# Patient Record
Sex: Female | Born: 1955 | Hispanic: Yes | Marital: Single
Health system: Southern US, Community
[De-identification: ages and names within clinical notes are randomized; demographics above are authoritative.]

---

## 2004-06-10 ENCOUNTER — Ambulatory Visit: Payer: Self-pay

## 2004-12-01 ENCOUNTER — Ambulatory Visit: Payer: Self-pay

## 2005-12-21 ENCOUNTER — Ambulatory Visit: Payer: Self-pay

## 2007-04-26 ENCOUNTER — Ambulatory Visit: Payer: Self-pay | Admitting: Family Medicine

## 2007-08-29 ENCOUNTER — Ambulatory Visit: Payer: Self-pay

## 2011-04-14 ENCOUNTER — Ambulatory Visit: Payer: Self-pay

## 2011-04-15 ENCOUNTER — Ambulatory Visit: Payer: Self-pay

## 2013-03-26 ENCOUNTER — Ambulatory Visit: Payer: Self-pay | Admitting: Family Medicine

## 2015-12-10 ENCOUNTER — Other Ambulatory Visit: Payer: Self-pay

## 2015-12-10 ENCOUNTER — Ambulatory Visit: Payer: Self-pay

## 2015-12-10 ENCOUNTER — Encounter: Payer: Self-pay | Admitting: *Deleted

## 2015-12-10 ENCOUNTER — Ambulatory Visit: Payer: Self-pay | Attending: Oncology | Admitting: *Deleted

## 2015-12-10 VITALS — BP 128/77 | HR 74 | Temp 98.0°F | Ht 60.63 in | Wt 179.6 lb

## 2015-12-10 DIAGNOSIS — Z0000 Encounter for general adult medical examination without abnormal findings: Secondary | ICD-10-CM

## 2015-12-10 DIAGNOSIS — N63 Unspecified lump in unspecified breast: Secondary | ICD-10-CM

## 2015-12-10 NOTE — Patient Instructions (Signed)
Prueba del VPH (HPV Test) La prueba del virus del papiloma humano (VPH) se usa para detectar los tipos de infeccin por el VPH de alto riesgo. El VPH es un grupo de alrededor de 100 virus. Muchos de estos virus causan tumores dentro de los genitales, sobre ellos o a su alrededor. La mayora de los VPH provocan infecciones que suelen desaparecen sin tratamiento. Sin embargo, los tipos 6, 11, 16 y 18 del VPH se consideran de alto riesgo y pueden aumentar el riesgo de padecer cncer de cuello del tero o de ano si la infeccin no se trata. La prueba del VPH identifica las cadenas de ADN (genticas) de la infeccin por el VPH, por lo que tambin se denomina prueba de ADN para el VPH. Aunque el VPH se encuentra tanto en los hombres como en las mujeres, la prueba del VPH se usa solo para detectar un mayor riesgo de cncer en las mujeres:  Con una prueba de Papanicolaou anormal.  Despus del tratamiento de una prueba de Papanicolaou anormal.  Entre 30y 65aos.  Despus del tratamiento de una infeccin por el VPH de alto riesgo. La prueba del VPH se puede hacer al mismo tiempo que un examen plvico y una prueba de Papanicolaou en mujeres de ms de 30 aos. Tanto la prueba del VPH como la prueba de Papanicolaou requieren una muestra de clulas del cuello del tero. PREPARACIN PARA EL ESTUDIO  No se haga lavados vaginales ni se d un bao durante 24 a 48 horas antes de la prueba o como se lo haya indicado el mdico.  No tenga sexo durante 24 a 48 horas antes de la prueba o como se lo haya indicado el mdico.  Es posible que se le pida que reprograme la prueba si est menstruando.  Se le pedir que orine antes de la prueba. RESULTADOS Es su responsabilidad retirar el resultado del estudio. Consulte en el laboratorio o en el departamento en el que fue realizado el estudio cundo y cmo podr obtener los resultados. Hable con el mdico si tiene alguna pregunta sobre los resultados. El resultado ser  negativo o positivo. Significado de los resultados negativos de la prueba Un resultado negativo de la prueba del VPH significa que no se detect el VPH, y es muy probable que no tenga el virus. Significado de los resultados positivos de la prueba Un resultado positivo de la prueba del VPH indica que tiene el virus.  Si el resultado de la prueba muestra la presencia de alguna cadena del VPH de alto riesgo, puede tener mayor riesgo de padecer cncer de cuello del tero o de ano si la infeccin no se trata.  Si se encuentran cadenas del VPH de bajo riesgo, es poco probable que tenga un alto riesgo de padecer cncer. Hable con el mdico sobre los resultados. El mdico utilizar los resultados para realizar un diagnstico y determinar un plan de tratamiento adecuado para usted. Esta informacin no tiene como fin reemplazar el consejo del mdico. Asegrese de hacerle al mdico cualquier pregunta que tenga. Document Released: 04/29/2008 Document Revised: 02/01/2014 Document Reviewed: 05/29/2013 Elsevier Interactive Patient Education  2017 [employer]  Gave patient hand-out, Women Staying Healthy, Active and Well from [facility], with education on breast health, pap smears, heart and colon health.  

## 2015-12-10 NOTE — Progress Notes (Signed)
Subjective:     Patient ID: Gloria Dunkerosalva Vallejano  Alvarez, female   DOB: 04/03/1955, 60 y.o.   MRN: [id number]  HPI   Review of Systems     Objective:   Physical Exam  Pulmonary/Chest:    Abdominal:    Genitourinary: No labial fusion. There is no rash, tenderness, lesion or injury on the right labia. There is no rash, tenderness, lesion or injury on the left labia. Cervix exhibits no motion tenderness, no discharge and no friability. Right adnexum displays no mass, no tenderness and no fullness. Left adnexum displays no mass, no tenderness and no fullness. No erythema or bleeding in the vagina. No foreign body in the vagina. No signs of injury around the vagina. No vaginal discharge found.       Assessment:     60 year [facility] Hispanic [facility]  Lloyda the interpreter present during the interview and exam.  On clinical breast breast exam bilateral breast have fibroglandular like tissue.  There is an asymmetrical 2 cm glandular like nodule at 8:00 right breast.  While the patient flexes her arms, there is notable induration at 8:00 right breast.  Taught self breast awareness.  Specimen collected for pap smear.  Patient has been screened for eligibility.  She does not have any insurance, Medicare or Medicaid.  She also meets financial eligibility.  Hand-out given on the Affordable Care Act.    Plan:     Bilateral diagnostic mammogram and ultrasound ordered.  Specimen for pap smear sent to the lab.  Will follow-up per protocol.

## 2015-12-14 LAB — PAP LB AND HPV HIGH-RISK
HPV, high-risk: NEGATIVE
PAP SMEAR COMMENT: 0

## 2015-12-17 ENCOUNTER — Ambulatory Visit
Admission: RE | Admit: 2015-12-17 | Discharge: 2015-12-17 | Disposition: A | Discharge status: Home / Self Care | Payer: Self-pay | Source: Ambulatory Visit | Attending: Oncology | Admitting: Oncology

## 2015-12-17 ENCOUNTER — Other Ambulatory Visit: Payer: Self-pay | Admitting: *Deleted

## 2015-12-17 ENCOUNTER — Ambulatory Visit: Payer: Self-pay | Attending: Oncology

## 2015-12-17 ENCOUNTER — Inpatient Hospital Stay
Admission: RE | Admit: 2015-12-17 | Discharge: 2015-12-17 | Disposition: A | Discharge status: Home / Self Care | Payer: Self-pay | Source: Ambulatory Visit | Attending: *Deleted | Admitting: *Deleted

## 2015-12-17 DIAGNOSIS — N63 Unspecified lump in unspecified breast: Secondary | ICD-10-CM

## 2015-12-17 DIAGNOSIS — Z9289 Personal history of other medical treatment: Secondary | ICD-10-CM

## 2015-12-22 ENCOUNTER — Other Ambulatory Visit: Payer: Self-pay | Admitting: *Deleted

## 2015-12-22 DIAGNOSIS — N63 Unspecified lump in unspecified breast: Secondary | ICD-10-CM

## 2015-12-24 ENCOUNTER — Other Ambulatory Visit: Payer: Self-pay | Admitting: *Deleted

## 2015-12-24 DIAGNOSIS — N63 Unspecified lump in unspecified breast: Secondary | ICD-10-CM

## 2016-01-05 ENCOUNTER — Ambulatory Visit
Admission: RE | Admit: 2016-01-05 | Discharge: 2016-01-05 | Disposition: A | Discharge status: Home / Self Care | Payer: Self-pay | Source: Ambulatory Visit | Attending: Oncology | Admitting: Oncology

## 2016-01-05 DIAGNOSIS — N63 Unspecified lump in unspecified breast: Secondary | ICD-10-CM

## 2016-01-08 ENCOUNTER — Encounter: Payer: Self-pay | Admitting: *Deleted

## 2016-01-08 NOTE — Progress Notes (Signed)
Patient had a cyst aspiration with complete resolution on 01/06/16.  She is to follow-up with annual screening in one year.  [facility] [place].

## 2017-08-22 ENCOUNTER — Encounter: Payer: Self-pay | Admitting: Obstetrics and Gynecology

## 2018-02-26 IMAGING — US US BREAST*R* LIMITED INC AXILLA
1 series · 6 of 6 positions shown · non-contrast
Comparison: Previous exam(s).

CLINICAL DATA: 60-year-old female with 2 cm smooth nodule felt by
her referring clinician at 8 o'clock in the right breast with
"notable induration while flexing" . Of note, the patient was
evaluated in 0530 for an indication of right breast induration 8-9
o'clock. The patient denies any current problems with her breasts.

EXAM:
2D DIGITAL DIAGNOSTIC BILATERAL MAMMOGRAM WITH CAD AND ADJUNCT TOMO
ULTRASOUND RIGHT BREAST

[Series 1: us breast*right* limited inc axilla · 0.06mm/px · 6 of 6 slices shown]
[im 1/6]
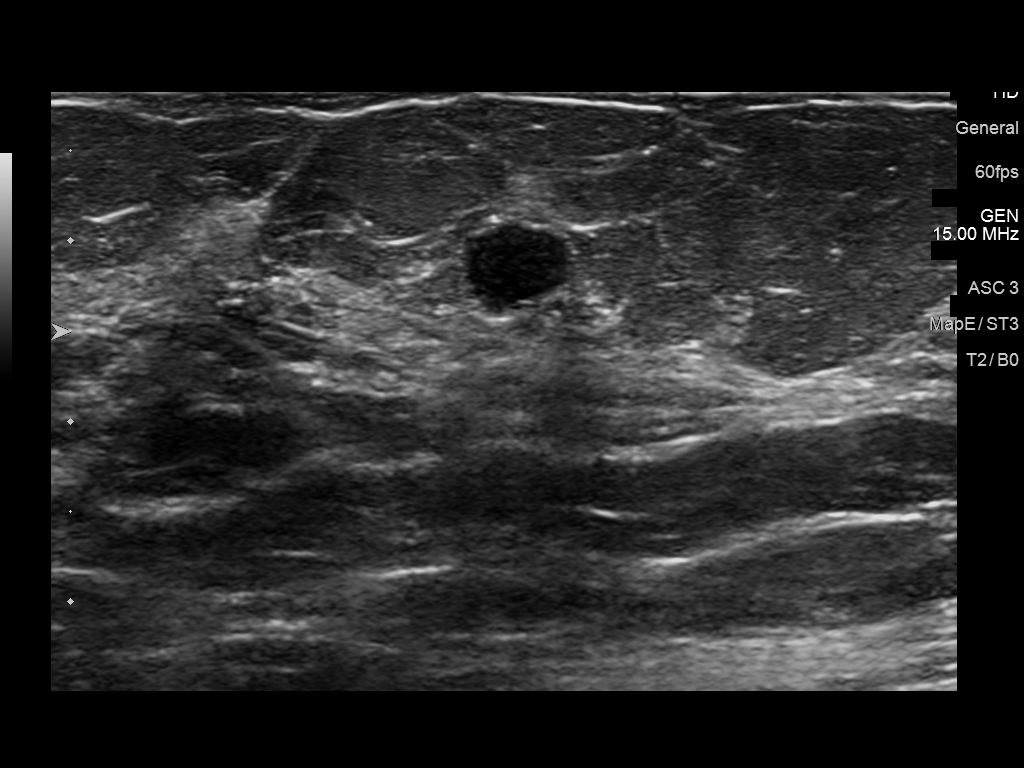
[im 2/6]
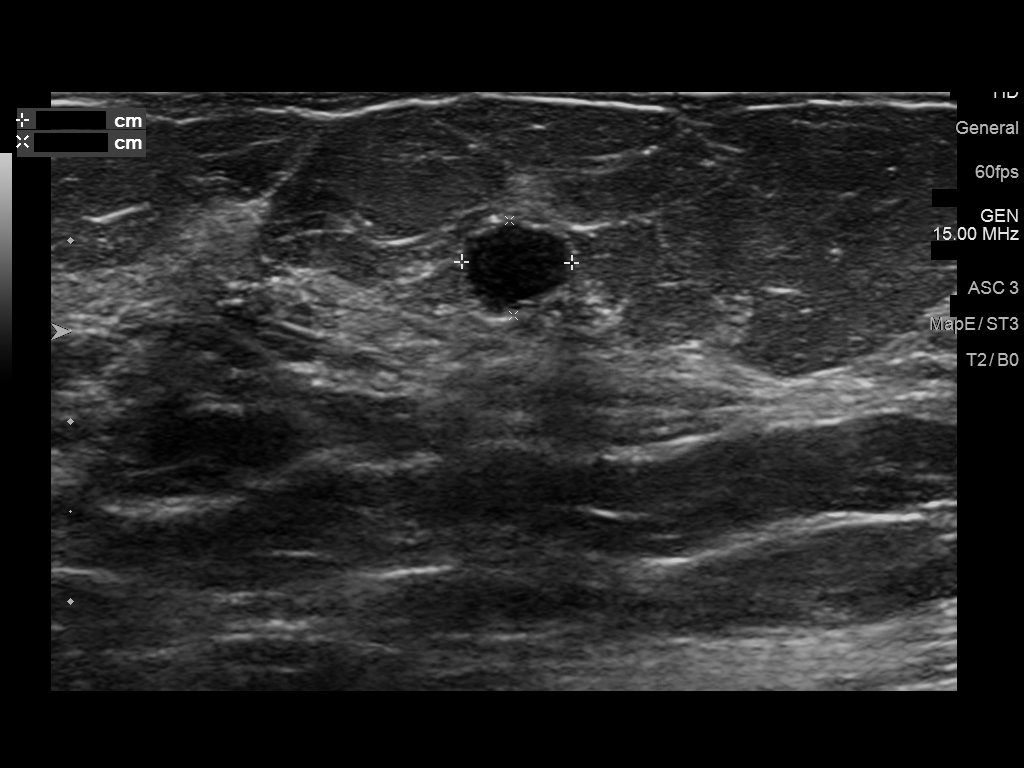
[im 3/6]
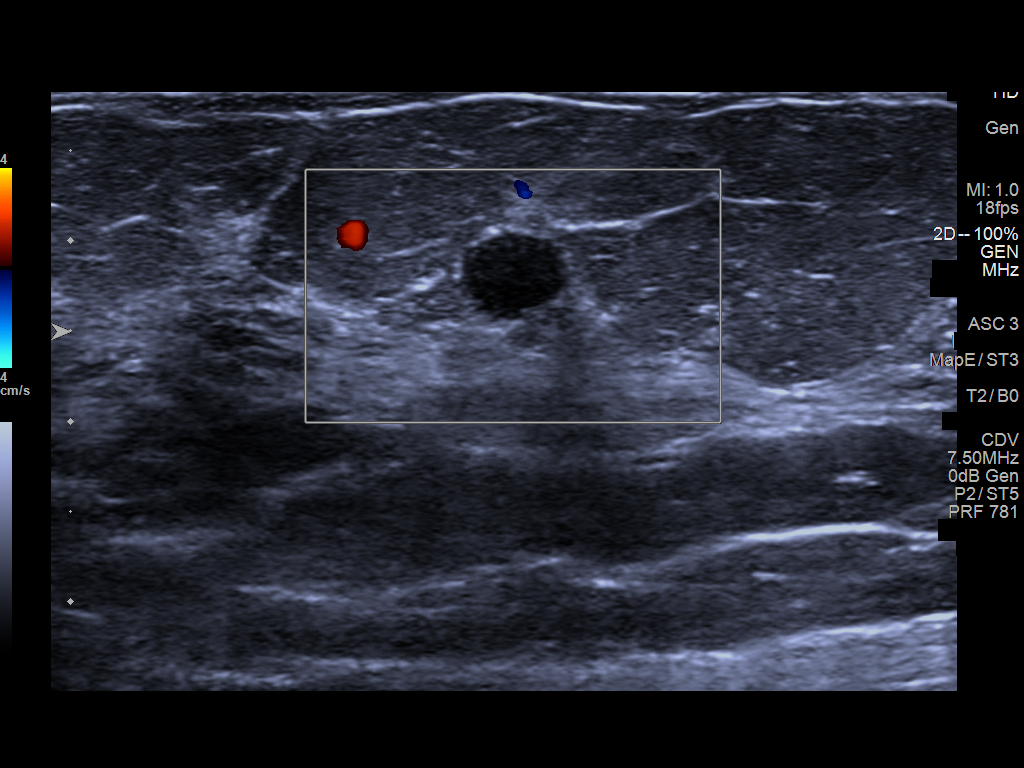
[im 4/6]
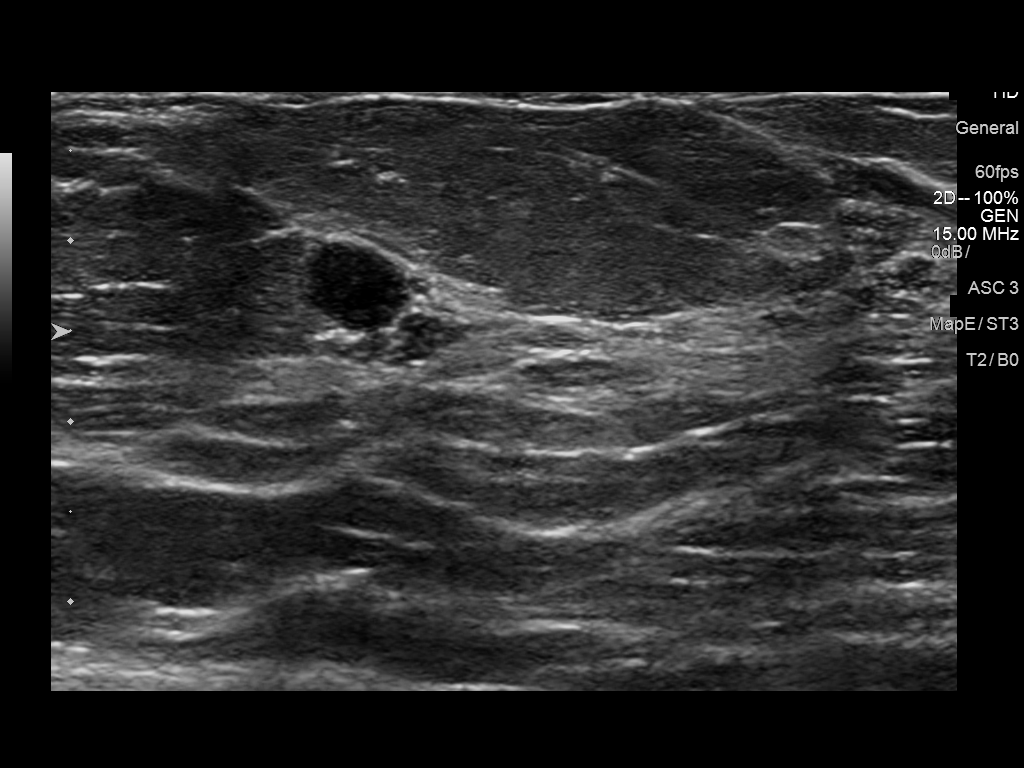
[im 5/6]
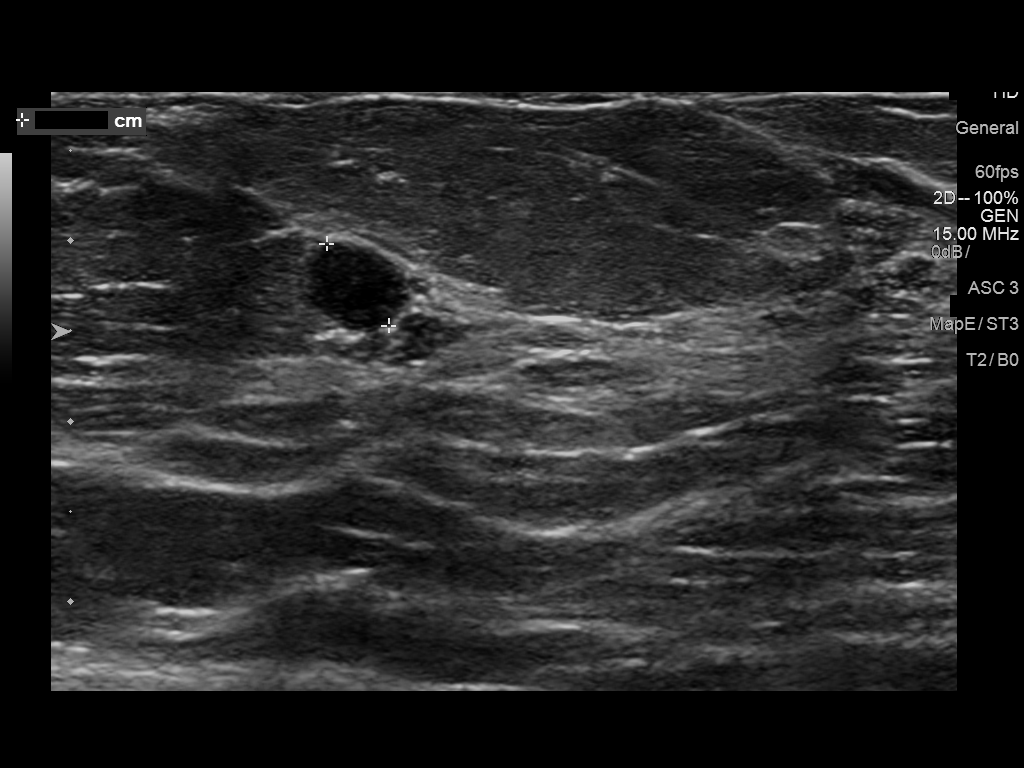
[im 6/6]
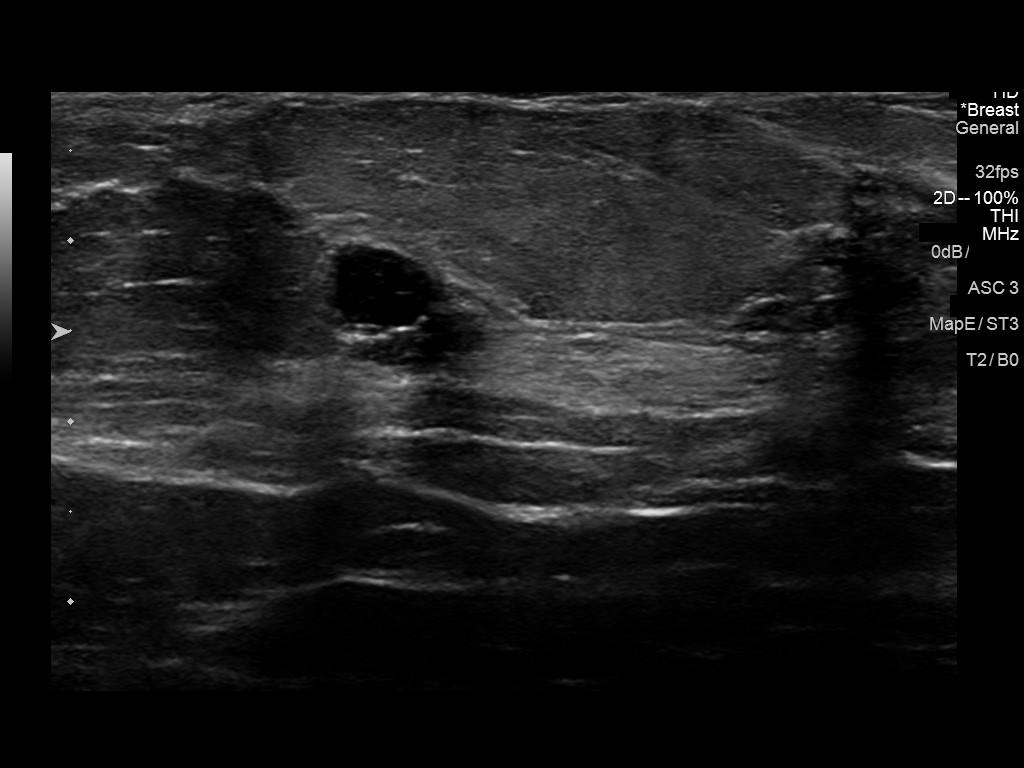

[6 of 6 positions shown; findings below may reference images not displayed]

ACR Breast Density Category c: The breast tissue is heterogeneously
dense, which may obscure small masses.
FINDINGS: There is a new 6 mm oval, circumscribed, hypoechoic mass at 12
o'clock, 5 cm from the nipple. No suspicious mass, calcifications,
or other abnormality is identified within the left breast.

Mammographic images were processed with CAD.

On physical exam, smooth, nodular tissue is felt in the area of
concern in the right breast from 8-9 o'clock. The patient reports an
area of indentation in the superior right breast which has been
present for many years and which she thinks has improved. No
indentation is noted on today's exam.

Targeted ultrasound of the right breast from 8-9 o'clock
demonstrates no suspicious appearing cystic or solid sonographic
finding. No mass was identified in the area of palpable concern.
Targeted ultrasound of the superior right breast demonstrates an
oval, hypoechoic mass at 12 o'clock, 5 cm from the nipple with
slightly irregular margins measuring 6 x 5 x 6 mm. No internal
vascularity is identified. This mass is thought to likely represent
a small complicated cyst; however, given the slightly irregular
margins, aspiration is recommended.
IMPRESSION: Probable complicated cyst within the right breast at 12 o'clock, 5
cm from the nipple.

RECOMMENDATION:
1. Ultrasound-guided aspiration of the right breast mass at 12
o'clock, 5 cm from the nipple. If unable to aspirate, an
ultrasound-guided biopsy is recommended.
2. Ultrasound of the right axilla is recommended at the time of
aspiration.
3. The patient was instructed to follow-up with her referring
clinician regarding the area of concern in her lateral right breast.

I have discussed the findings and recommendations with the patient.
Results were also provided in writing at the conclusion of the
visit. If applicable, a reminder letter will be sent to the patient
regarding the next appointment.

BI-RADS CATEGORY  4: Suspicious.

## 2018-06-30 IMAGING — MG US ASPIRATION RIGHT BREAST
1 series · 2 of 2 positions shown · non-contrast
Comparison: Previous exams.

CLINICAL DATA: 60-year-old female evaluate solid or cystic nature
of upper right breast mass.

EXAM:
ULTRASOUND GUIDED RIGHT BREAST CYST ASPIRATION

[Series 1: MG view · 0.05mm/px · 2 of 2 slices shown]
[im 1/2]
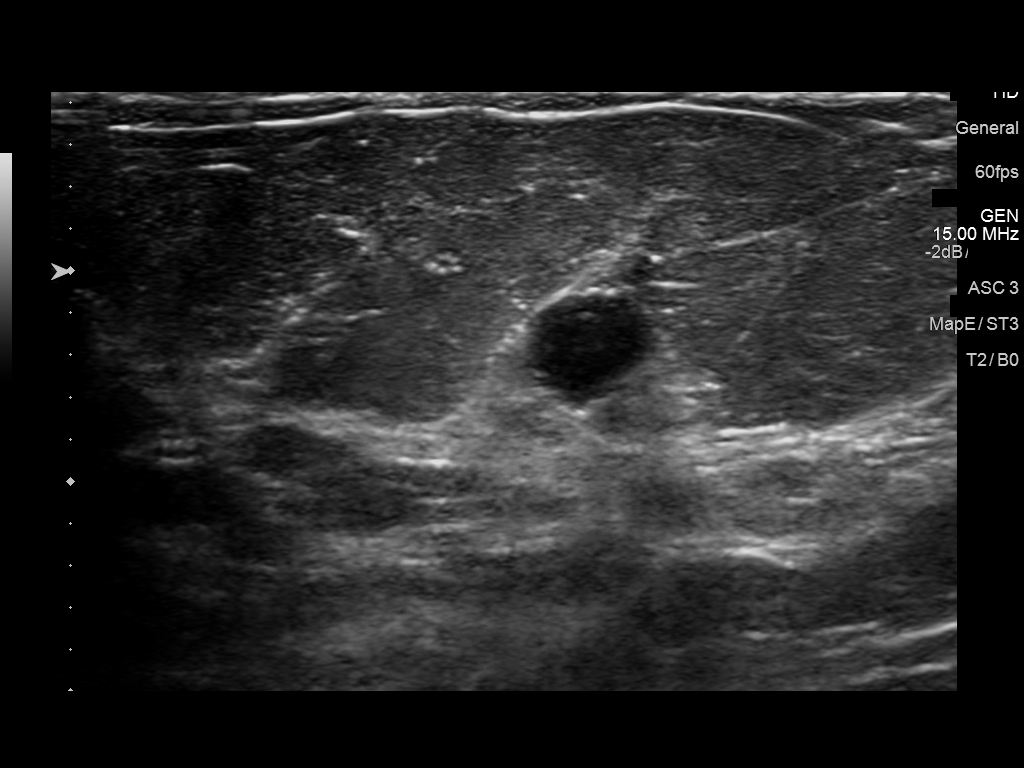
[im 2/2]
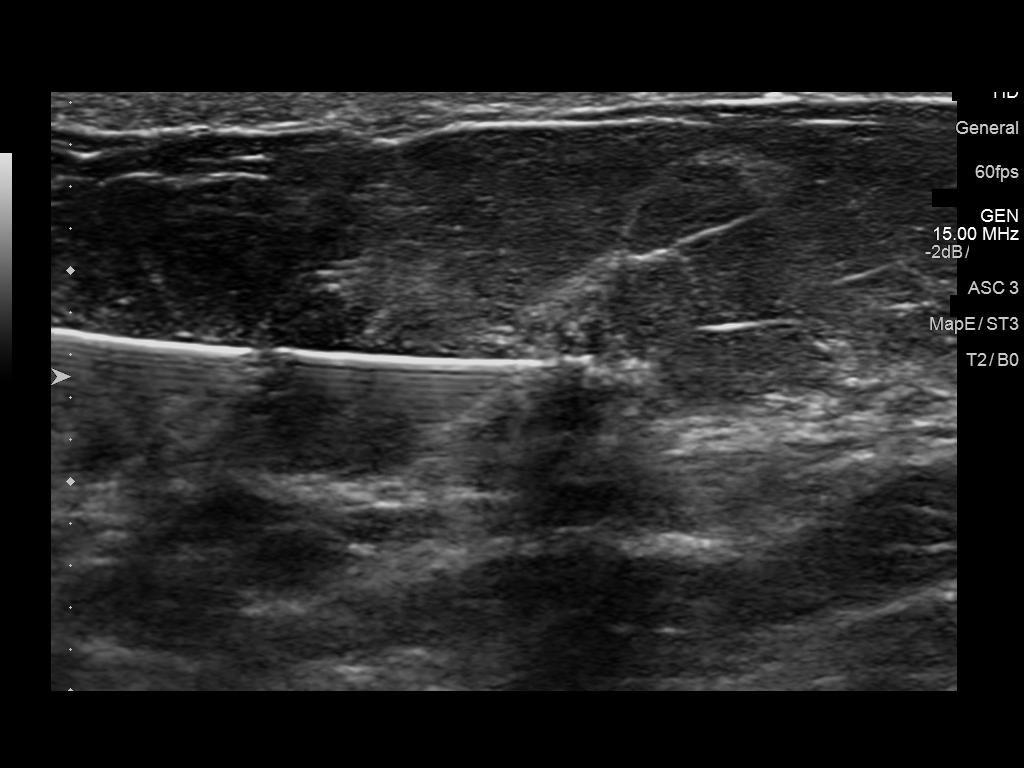

[2 of 2 positions shown; findings below may reference images not displayed]

PROCEDURE:
Using sterile technique, 1% lidocaine, under direct ultrasound
visualization, needle aspiration of the 6 mm very hypoechoic mass at
the 12 o'clock position of the right breast 5 cm from the nipple was
performed. A small amount of yellow serous fluid was aspirated with
complete collapse of the cyst in no residual solid components,
compatible with a benign cyst.

Ultrasound of the right axilla demonstrated no abnormal appearing
lymph nodes.
IMPRESSION: Ultrasound-guided aspiration of benign cyst in the upper right
breast No apparent complications.

RECOMMENDATIONS:
Bilateral screening mammograms in 1 year.

## 2018-12-29 ENCOUNTER — Other Ambulatory Visit: Payer: Self-pay | Admitting: Obstetrics and Gynecology

## 2020-02-22 ENCOUNTER — Ambulatory Visit (LOCAL_COMMUNITY_HEALTH_CENTER): Payer: Self-pay

## 2020-02-22 ENCOUNTER — Other Ambulatory Visit: Payer: Self-pay

## 2020-02-22 DIAGNOSIS — Z7185 Encounter for immunization safety counseling: Secondary | ICD-10-CM

## 2020-02-22 DIAGNOSIS — Z539 Procedure and treatment not carried out, unspecified reason: Secondary | ICD-10-CM

## 2020-02-22 DIAGNOSIS — R7611 Nonspecific reaction to tuberculin skin test without active tuberculosis: Secondary | ICD-10-CM

## 2020-02-22 NOTE — Progress Notes (Signed)
Per [facility], patient too early for her 2nd dose MMR and Varicella.  Patient due 03/17/2020.  Appointment scheduled for 03/18/2020 at 4 pm (arrival time 3:45 pm). Dahlia Bailiff, RN

## 2020-02-22 NOTE — Progress Notes (Signed)
Patient presents to [facility] today for a TB Screen per immigration provider.  She had a + PPD 01/18/2013 that read 44mm.  She provided a CXR from [facility] that was obtained on 02/12/2020.  It was negative for active TB.  Dr. Lyndel Safe gave clearance today for TB Screeening (see scanned documents).  TB Screen copy given to patient for immigration.

## 2020-03-18 ENCOUNTER — Ambulatory Visit: Payer: Self-pay

## 2020-03-19 ENCOUNTER — Ambulatory Visit (LOCAL_COMMUNITY_HEALTH_CENTER): Payer: Self-pay

## 2020-03-19 DIAGNOSIS — Z23 Encounter for immunization: Secondary | ICD-10-CM

## 2020-03-19 NOTE — Progress Notes (Signed)
MMR anda Varicella given today; tolerated well.  Patient states she paid at her last appt but didn't get vaccines because it was too early. Aileen Fass, RN
